# Patient Record
Sex: Female | Born: 1990 | Race: Black or African American | Hispanic: No | Marital: Single | Smoking: Never smoker
Health system: Southern US, Community
[De-identification: ages and names within clinical notes are randomized; demographics above are authoritative.]

## PROBLEM LIST (undated history)

## (undated) DIAGNOSIS — G43909 Migraine, unspecified, not intractable, without status migrainosus: Secondary | ICD-10-CM

---

## 2013-03-07 ENCOUNTER — Encounter (HOSPITAL_BASED_OUTPATIENT_CLINIC_OR_DEPARTMENT_OTHER): Payer: Self-pay | Admitting: Emergency Medicine

## 2013-03-07 ENCOUNTER — Emergency Department (HOSPITAL_BASED_OUTPATIENT_CLINIC_OR_DEPARTMENT_OTHER): Payer: BC Managed Care – PPO

## 2013-03-07 ENCOUNTER — Emergency Department (HOSPITAL_BASED_OUTPATIENT_CLINIC_OR_DEPARTMENT_OTHER)
Admission: EM | Admit: 2013-03-07 | Discharge: 2013-03-07 | Disposition: A | Discharge status: Home / Self Care | Payer: BC Managed Care – PPO | Attending: Emergency Medicine | Admitting: Emergency Medicine

## 2013-03-07 DIAGNOSIS — B9789 Other viral agents as the cause of diseases classified elsewhere: Secondary | ICD-10-CM | POA: Insufficient documentation

## 2013-03-07 DIAGNOSIS — J3489 Other specified disorders of nose and nasal sinuses: Secondary | ICD-10-CM | POA: Insufficient documentation

## 2013-03-07 DIAGNOSIS — B349 Viral infection, unspecified: Secondary | ICD-10-CM

## 2013-03-07 DIAGNOSIS — Z3202 Encounter for pregnancy test, result negative: Secondary | ICD-10-CM | POA: Insufficient documentation

## 2013-03-07 DIAGNOSIS — G43909 Migraine, unspecified, not intractable, without status migrainosus: Secondary | ICD-10-CM | POA: Insufficient documentation

## 2013-03-07 HISTORY — DX: Migraine, unspecified, not intractable, without status migrainosus: G43.909

## 2013-03-07 LAB — URINALYSIS, ROUTINE W REFLEX MICROSCOPIC
Bilirubin Urine: NEGATIVE
Glucose, UA: NEGATIVE mg/dL
Hgb urine dipstick: NEGATIVE
Leukocytes, UA: NEGATIVE
Protein, ur: NEGATIVE mg/dL
pH: 5 (ref 5.0–8.0)

## 2013-03-07 LAB — PREGNANCY, URINE: Preg Test, Ur: NEGATIVE

## 2013-03-07 MED ORDER — METOCLOPRAMIDE HCL 5 MG/ML IJ SOLN
10.0000 mg | Freq: Once | INTRAMUSCULAR | Status: AC
  Administered 2013-03-07: 10 mg via INTRAMUSCULAR
  Filled 2013-03-07: qty 2

## 2013-03-07 MED ORDER — IBUPROFEN 800 MG PO TABS: 800.0000 mg | ORAL_TABLET | Freq: Three times a day (TID) | ORAL | Status: DC

## 2013-03-07 MED ORDER — KETOROLAC TROMETHAMINE 60 MG/2ML IM SOLN
60.0000 mg | Freq: Once | INTRAMUSCULAR | Status: AC
  Administered 2013-03-07: 60 mg via INTRAMUSCULAR
  Filled 2013-03-07: qty 2

## 2013-03-07 MED ORDER — DIPHENHYDRAMINE HCL 50 MG/ML IJ SOLN
12.5000 mg | Freq: Once | INTRAMUSCULAR | Status: AC
  Administered 2013-03-07: 12.5 mg via INTRAMUSCULAR
  Filled 2013-03-07: qty 1

## 2013-03-07 NOTE — ED Notes (Signed)
Denies known contact with anyone who has been ill or has travelled in the past 21 days.

## 2013-03-07 NOTE — ED Notes (Signed)
PO challenge Sprite

## 2013-03-07 NOTE — ED Notes (Signed)
Pt reports fever (unmeasured) for one week, with stuffy nose, body aches, and intermittent vomiting and diarrhea (last episode yesterday morning). Pt has history of "daily" migraines.

## 2013-03-07 NOTE — ED Provider Notes (Signed)
CSN: [id number]     Arrival date & time 03/07/13  0228 History   First MD Initiated Contact with Patient 03/07/13 0247     Chief Complaint  Patient presents with  . Fever   (Consider location/radiation/quality/duration/timing/severity/associated sxs/prior Treatment) Patient is a 22 y.o. female presenting with URI. The history is provided by the patient.  URI Presenting symptoms: congestion and rhinorrhea   Congestion:    Location:  Nasal   Interferes with sleep: no     Interferes with eating/drinking: no   Rhinorrhea:    Quality:  Clear   Severity:  Moderate   Timing:  Constant   Progression:  Unchanged Severity:  Moderate Onset quality:  Gradual Timing:  Constant Progression:  Unchanged Chronicity:  New Relieved by:  Nothing Worsened by:  Nothing tried Ineffective treatments:  None tried Associated symptoms: no neck pain, no swollen glands and no wheezing   Associated symptoms comment:  Has had daily migraines for years nothing different.  No travel x 2 years no tick exposure no rashes Risk factors: no recent travel   loose stool x 1 day and vomiting 2 days ago  Past Medical History  Diagnosis Date  . Migraine    History reviewed. No pertinent past surgical history. History reviewed. No pertinent family history. History  Substance Use Topics  . Smoking status: Never Smoker   . Smokeless tobacco: Never Used  . Alcohol Use: No   OB History   Grav Para Term Preterm Abortions TAB SAB Ect Mult Living                 Review of Systems  HENT: Positive for congestion and rhinorrhea.   Respiratory: Negative for wheezing.   Musculoskeletal: Negative for neck pain.  All other systems reviewed and are negative.    Allergies  Review of patient's allergies indicates no known allergies.  Home Medications   Current Outpatient Rx  Name  Route  Sig  Dispense  Refill  . acetaminophen (TYLENOL) 500 MG tablet   Oral   Take 500 mg by mouth every 6 (six) hours as needed  for pain.         Marland Kitchen aspirin-acetaminophen-caffeine (EXCEDRIN MIGRAINE) 250-250-65 MG per tablet   Oral   Take 1 tablet by mouth every 6 (six) hours as needed for pain.          BP 139/74  Pulse 97  Temp(Src) 101.9 F (38.8 C) (Oral)  Resp 24  Ht 5\' 3"  (1.6 m)  SpO2 100%  LMP 02/05/2013 Physical Exam  Constitutional: She is oriented to person, place, and time. She appears well-developed and well-nourished. No distress.  HENT:  Head: Normocephalic and atraumatic.  Mouth/Throat: Oropharynx is clear and moist.  Eyes: Conjunctivae are normal. Pupils are equal, round, and reactive to light.  Neck: Normal range of motion. Neck supple.  No meningeal signs  Cardiovascular: Normal rate, regular rhythm and intact distal pulses.   Pulmonary/Chest: Effort normal and breath sounds normal. She has no wheezes. She has no rales.  Abdominal: Soft. Bowel sounds are normal. There is no tenderness. There is no rebound and no guarding.  Lymphadenopathy:    She has no cervical adenopathy.  Neurological: She is alert and oriented to person, place, and time. She has normal reflexes. No cranial nerve deficit.  Skin: Skin is warm and dry.  Psychiatric: She has a normal mood and affect.    ED Course  Procedures (including critical care time) Labs Review Labs Reviewed  URINALYSIS, ROUTINE W REFLEX MICROSCOPIC  PREGNANCY, URINE   Imaging Review No results found.  EKG Interpretation   None       MDM  No diagnosis found. Viral syndrome.  No indication for labs at this time.  Will treat symptomatically with high dose ibuprofen.  Migraine no different than usual no indication for LP.  Follow up with your regular doctor for ongoing care.      Jasmine Awe, MD 03/07/13 [phone]

## 2014-10-20 ENCOUNTER — Encounter (HOSPITAL_BASED_OUTPATIENT_CLINIC_OR_DEPARTMENT_OTHER): Payer: Self-pay | Admitting: *Deleted

## 2014-10-20 DIAGNOSIS — T366X5A Adverse effect of rifampicins, initial encounter: Secondary | ICD-10-CM | POA: Diagnosis not present

## 2014-10-20 DIAGNOSIS — Z79899 Other long term (current) drug therapy: Secondary | ICD-10-CM | POA: Insufficient documentation

## 2014-10-20 DIAGNOSIS — R21 Rash and other nonspecific skin eruption: Secondary | ICD-10-CM | POA: Diagnosis present

## 2014-10-20 DIAGNOSIS — G43909 Migraine, unspecified, not intractable, without status migrainosus: Secondary | ICD-10-CM | POA: Insufficient documentation

## 2014-10-20 DIAGNOSIS — Z791 Long term (current) use of non-steroidal anti-inflammatories (NSAID): Secondary | ICD-10-CM | POA: Diagnosis not present

## 2014-10-20 DIAGNOSIS — L270 Generalized skin eruption due to drugs and medicaments taken internally: Secondary | ICD-10-CM | POA: Insufficient documentation

## 2014-10-20 NOTE — ED Notes (Signed)
Pt c/o rash to face x 3 days 

## 2014-10-21 ENCOUNTER — Emergency Department (HOSPITAL_BASED_OUTPATIENT_CLINIC_OR_DEPARTMENT_OTHER)
Admission: EM | Admit: 2014-10-21 | Discharge: 2014-10-21 | Disposition: A | Discharge status: Home / Self Care | Payer: 59 | Attending: Emergency Medicine | Admitting: Emergency Medicine

## 2014-10-21 DIAGNOSIS — L270 Generalized skin eruption due to drugs and medicaments taken internally: Secondary | ICD-10-CM

## 2014-10-21 MED ORDER — FAMOTIDINE 20 MG PO TABS: 20.0000 mg | ORAL_TABLET | Freq: Every day | ORAL | Status: DC

## 2014-10-21 MED ORDER — PREDNISONE 20 MG PO TABS: 60.0000 mg | ORAL_TABLET | Freq: Every day | ORAL | Status: DC

## 2014-10-21 MED ORDER — FAMOTIDINE 20 MG PO TABS
20.0000 mg | ORAL_TABLET | Freq: Once | ORAL | Status: AC
  Administered 2014-10-21: 20 mg via ORAL
  Filled 2014-10-21: qty 1

## 2014-10-21 MED ORDER — DIPHENHYDRAMINE HCL 25 MG PO TABS: 25.0000 mg | ORAL_TABLET | Freq: Three times a day (TID) | ORAL | Status: DC | PRN

## 2014-10-21 MED ORDER — PREDNISONE 50 MG PO TABS
60.0000 mg | ORAL_TABLET | Freq: Once | ORAL | Status: AC
  Administered 2014-10-21: 60 mg via ORAL
  Filled 2014-10-21 (×2): qty 1

## 2014-10-21 NOTE — Discharge Instructions (Signed)
Drug Allergy °Allergic reactions to medicines are common. Some allergic reactions are mild. A delayed type of drug allergy that occurs 1 week or more after exposure to a medicine or vaccine is called serum sickness. A life-threatening, sudden (acute) allergic reaction that involves the whole body is called anaphylaxis. °CAUSES  °"True" drug allergies occur when there is an allergic reaction to a medicine. This is caused by overactivity of the immune system. First, the body becomes sensitized. The immune system is triggered by your first exposure to the medicine. Following this first exposure, future exposure to the same medicine may be life-threatening. °Almost any medicine can cause an allergic reaction. Common ones are: °· Penicillin. °· Sulfonamides (sulfa drugs). °· Local anesthetics. °· X-ray dyes that contain iodine. °SYMPTOMS  °Common symptoms of a minor allergic reaction are: °· Swelling around the mouth. °· An itchy red rash or hives. °· Vomiting or diarrhea. °Anaphylaxis can cause swelling of the mouth and throat. This makes it difficult to breathe and swallow. Severe reactions can be fatal within seconds, even after exposure to only a trace amount of the drug that causes the reaction. °HOME CARE INSTRUCTIONS  °· If you are unsure of what caused your reaction, keep a diary of foods and medicines used. Include the symptoms that followed. Avoid anything that causes reactions. °· You may want to follow up with an allergy specialist after the reaction has cleared in order to be tested to confirm the allergy. It is important to confirm that your reaction is an allergy, not just a side effect to the medicine. If you have a true allergy to a medicine, this may prevent that medicine and related medicines from being given to you when you are very ill. °· If you have hives or a rash: °¨ Take medicines as directed by your caregiver. °¨ You may use an over-the-counter antihistamine (diphenhydramine) as  needed. °¨ Apply cold compresses to the skin or take baths in cool water. Avoid hot baths or showers. °· If you are severely allergic: °¨ Continuous observation after a severe reaction may be needed. Hospitalization is often required. °¨ Wear a medical alert bracelet or necklace stating your allergy. °¨ You and your family must learn how to use an anaphylaxis kit or give an epinephrine injection to temporarily treat an emergency allergic reaction. If you have had a severe reaction, always carry your epinephrine injection or anaphylaxis kit with you. This can be lifesaving if you have a severe reaction. °· Do not drive or perform tasks after treatment until the medicines used to treat your reaction have worn off, or until your caregiver says it is okay. °SEEK MEDICAL CARE IF:  °· You think you had an allergic reaction. Symptoms usually start within 30 minutes after exposure. °· Symptoms are getting worse rather than better. °· You develop new symptoms. °· The symptoms that brought you to your caregiver return. °SEEK IMMEDIATE MEDICAL CARE IF:  °· You have swelling of the mouth, difficulty breathing, or wheezing. °· You have a tight feeling in your chest or throat. °· You develop hives, swelling, or itching all over your body. °· You develop severe vomiting or diarrhea. °· You feel faint or pass out. °This is an emergency. Use your epinephrine injection or anaphylaxis kit as you have been instructed. Call for emergency medical help. Even if you improve after the injection, you need to be examined at a hospital emergency department. °MAKE SURE YOU:  °· Understand these instructions. °· Will watch   your condition.  Will get help right away if you are not doing well or get worse. Document Released: 04/30/2005 Document Revised: 07/23/2011 Document Reviewed: 10/04/2010 [facility] Patient Information 2015 [place], [place]. This information is not intended to replace advice given to you by your health care provider. Make  sure you discuss any questions you have with your health care provider.

## 2014-10-21 NOTE — ED Provider Notes (Signed)
CSN: [id number]     Arrival date & time 10/20/14  2304 History   First MD Initiated Contact with Patient 10/21/14 0047     Chief Complaint  Patient presents with  . Rash     (Consider location/radiation/quality/duration/timing/severity/associated sxs/prior Treatment) HPI  This a 24 year old female who presents with a rash to her face. Patient reports 3 day history of itchy rash over her face. She states that it burns and itches. Denies any new lotions, soaps, detergents, hair products, foods. 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 Denies any shortness of breath, chest pain. Has not taken anything for her symptoms.  Past Medical History  Diagnosis Date  . Migraine    History reviewed. No pertinent past surgical history. History reviewed. No pertinent family history. History  Substance Use Topics  . Smoking status: Never Smoker   . Smokeless tobacco: Never Used  . Alcohol Use: No   OB History    No data available     Review of Systems  Constitutional: Negative for fever.  Respiratory: Negative for chest tightness, shortness of breath and wheezing.   Cardiovascular: Negative for chest pain.  Skin: Positive for rash.  All other systems reviewed and are negative.     Allergies  Review of patient's allergies indicates no known allergies.  Home Medications   Prior to Admission medications   Medication Sig Start Date End Date Taking? Authorizing Provider  rifampin (RIFADIN) 300 MG capsule Take by mouth daily.   Yes Historical Provider, MD  acetaminophen (TYLENOL) 500 MG tablet Take 500 mg by mouth every 6 (six) hours as needed for pain.    Historical Provider, MD  aspirin-acetaminophen-caffeine (EXCEDRIN MIGRAINE) [phone] MG per tablet Take 1 tablet by mouth every 6 (six) hours as needed for pain.    Historical Provider, MD  diphenhydrAMINE (BENADRYL) 25 MG tablet Take 1 tablet (25 mg total) by mouth every 8  (eight) hours as needed for itching. 10/21/14   Shon Baton, MD  famotidine (PEPCID) 20 MG tablet Take 1 tablet (20 mg total) by mouth daily. 10/21/14   Shon Baton, MD  ibuprofen (ADVIL,MOTRIN) 800 MG tablet Take 1 tablet (800 mg total) by mouth 3 (three) times daily. 03/07/13   April Palumbo, MD  predniSONE (DELTASONE) 20 MG tablet Take 3 tablets (60 mg total) by mouth daily with breakfast. 10/21/14   Shon Baton, MD   BP 132/82 mmHg  Temp(Src) 98.5 F (36.9 C)  Resp 18  Ht  (1.6 m)  Wt 117 lb (53.071 kg)  BMI 20.73 kg/m2  SpO2 98% Physical Exam  Constitutional: She is oriented to person, place, and time. She appears well-developed and well-nourished. No distress.  HENT:  Head: Normocephalic and atraumatic.  Cardiovascular: Normal rate, regular rhythm and normal heart sounds.   Pulmonary/Chest: Effort normal. No respiratory distress. She has no wheezes.  Neurological: She is alert and oriented to person, place, and time.  Skin: Skin is warm and dry.  Fine papular rash over the face, no significant erythema  Psychiatric: She has a normal mood and affect.  Nursing note and vitals reviewed.   ED Course  Procedures (including critical care time) Labs Review Labs Reviewed - No data to display  Imaging Review No results found.   EKG Interpretation None      MDM   Final diagnoses:  Drug rash    Patient presents with rash over the face. It is  itchy and burning. Suspect allergic component versus dermatitis. Rifampin is associated with a drug rash. Unclear if this is the cause. Will place patient on spheroids, Pepcid, and Benadryl. 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  After history, exam, and medical workup I feel the patient has been appropriately medically screened and is safe for discharge home. Pertinent diagnoses were discussed with the patient. Patient was given return  precautions.     Shon Baton, MD 10/21/14 [phone]

## 2014-10-21 NOTE — ED Notes (Signed)
C/o itching and burning to face and eyes x 3 days,,  Denies any changes in soaps, etc.

## 2015-01-16 IMAGING — CR DG CHEST 2V
2 series · 2 of 2 positions shown · non-contrast
Comparison: None available

CLINICAL DATA: Fever

EXAM:
CHEST  2 VIEW

[w chest pa]
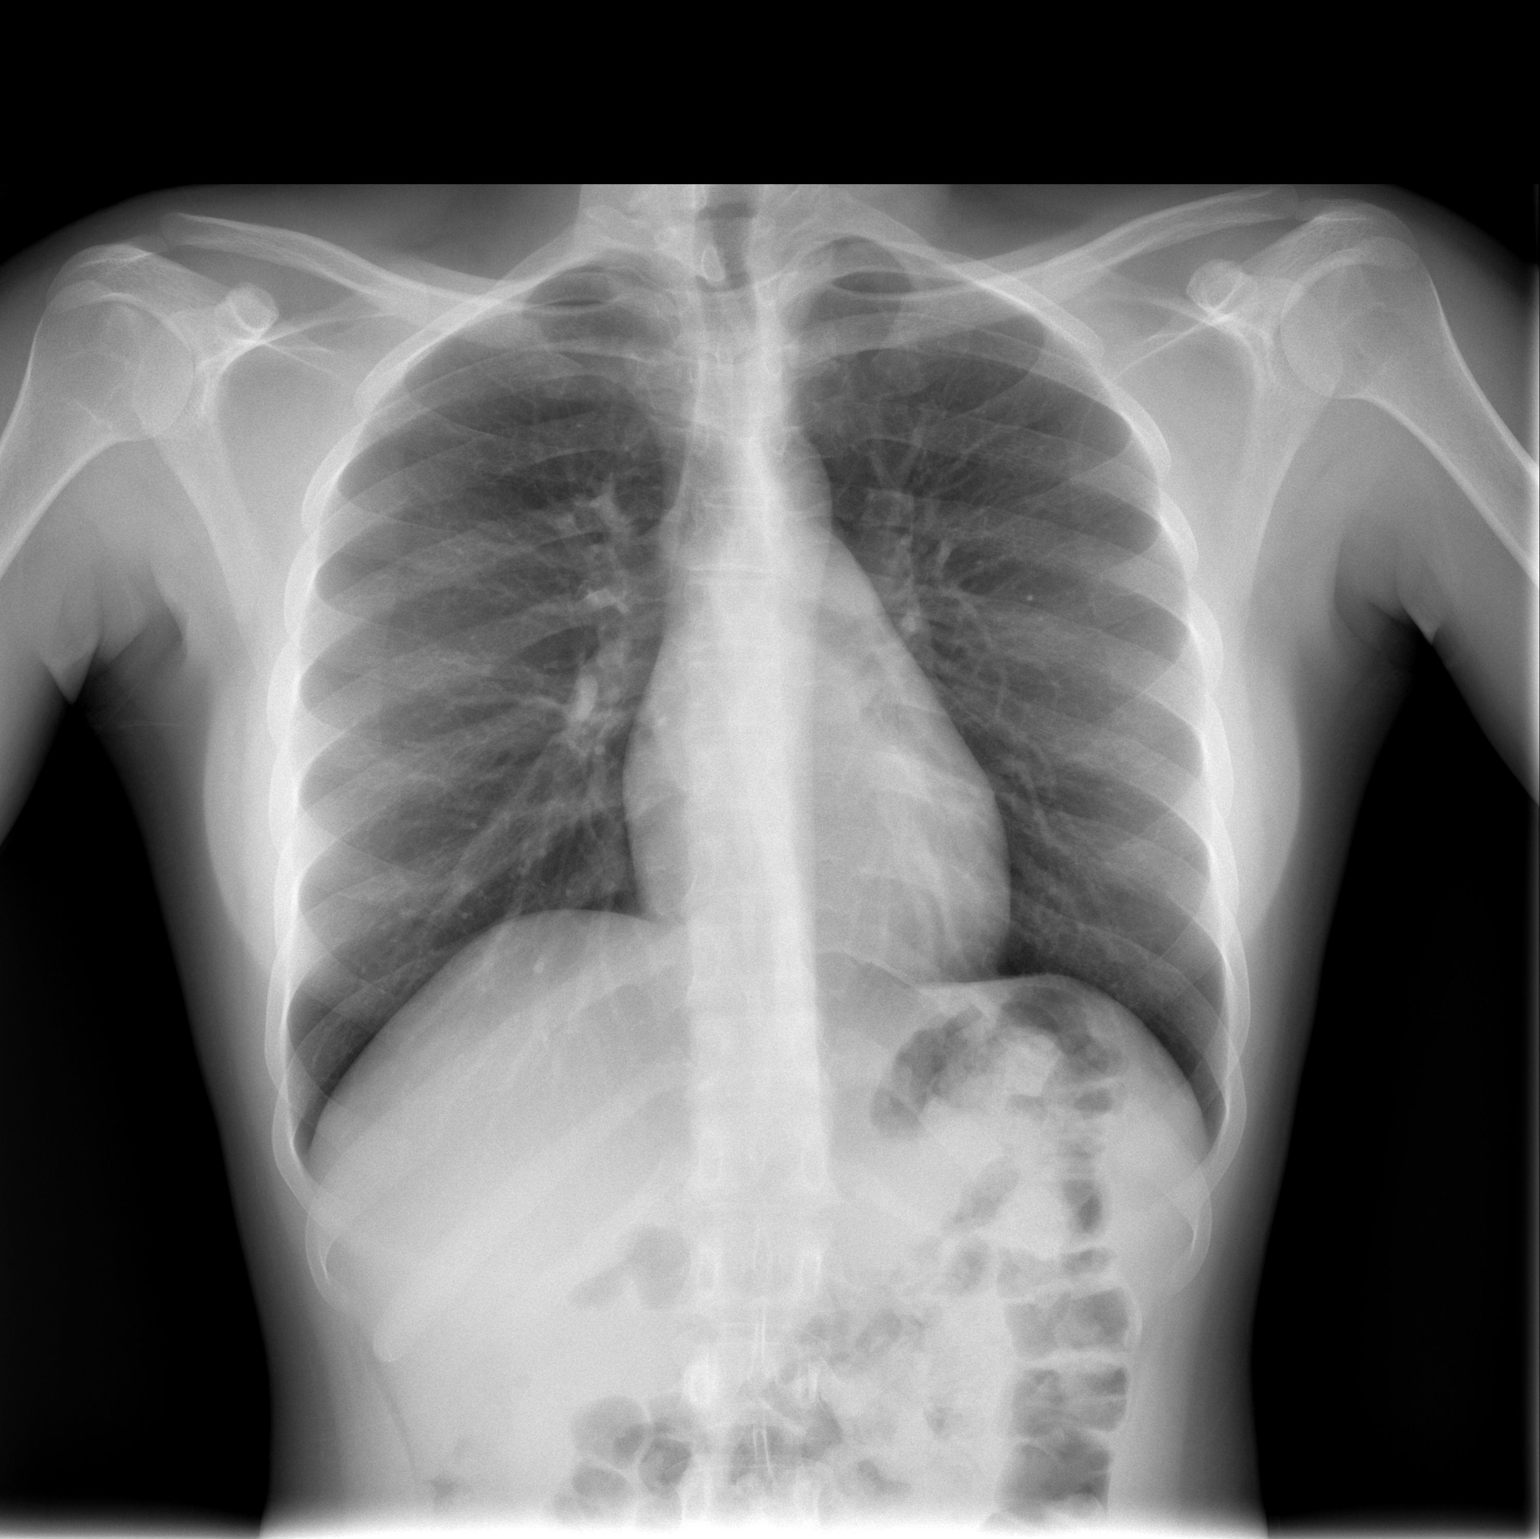

[w chest lat]
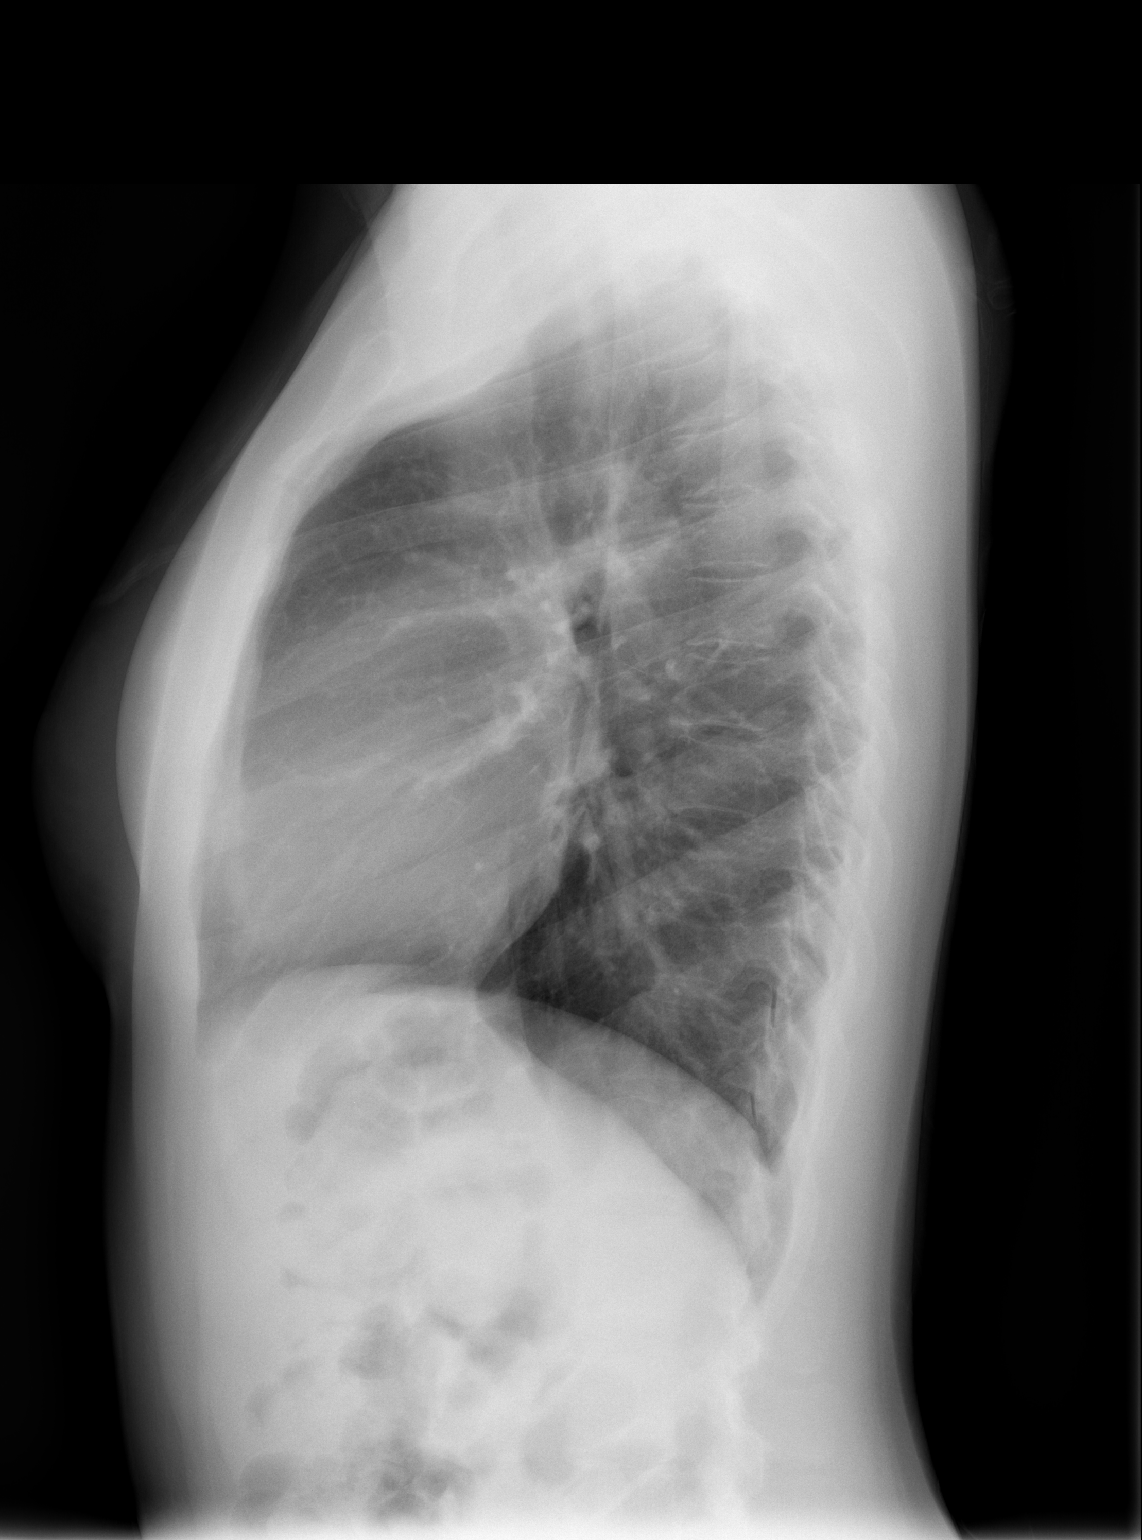

[2 of 2 positions shown; findings below may reference images not displayed]

FINDINGS: The cardiac and mediastinal silhouettes are within normal limits.

The lungs are normally inflated. No airspace consolidation, pleural
effusion, or pulmonary edema is identified. There is no
pneumothorax.

No acute osseous abnormality identified.
IMPRESSION: Normal radiograph of the chest with no focal infiltrate to suggest
acute infectious pneumonitis identified.

## 2018-12-18 ENCOUNTER — Ambulatory Visit: Payer: Self-pay | Admitting: Podiatry

## 2021-11-22 ENCOUNTER — Encounter (HOSPITAL_BASED_OUTPATIENT_CLINIC_OR_DEPARTMENT_OTHER): Payer: Self-pay | Admitting: Emergency Medicine

## 2021-11-22 ENCOUNTER — Emergency Department (HOSPITAL_BASED_OUTPATIENT_CLINIC_OR_DEPARTMENT_OTHER): Payer: BC Managed Care – PPO

## 2021-11-22 ENCOUNTER — Emergency Department (HOSPITAL_BASED_OUTPATIENT_CLINIC_OR_DEPARTMENT_OTHER)
Admission: EM | Admit: 2021-11-22 | Discharge: 2021-11-22 | Disposition: A | Discharge status: Home / Self Care | Payer: BC Managed Care – PPO | Attending: Emergency Medicine | Admitting: Emergency Medicine

## 2021-11-22 ENCOUNTER — Other Ambulatory Visit: Payer: Self-pay

## 2021-11-22 DIAGNOSIS — J029 Acute pharyngitis, unspecified: Secondary | ICD-10-CM | POA: Diagnosis present

## 2021-11-22 DIAGNOSIS — U071 COVID-19: Secondary | ICD-10-CM | POA: Insufficient documentation

## 2021-11-22 DIAGNOSIS — Z7982 Long term (current) use of aspirin: Secondary | ICD-10-CM | POA: Diagnosis not present

## 2021-11-22 LAB — SARS CORONAVIRUS 2 BY RT PCR: SARS Coronavirus 2 by RT PCR: POSITIVE — AB

## 2021-11-22 LAB — GROUP A STREP BY PCR: Group A Strep by PCR: NOT DETECTED

## 2021-11-22 MED ORDER — KETOROLAC TROMETHAMINE 30 MG/ML IJ SOLN
30.0000 mg | Freq: Once | INTRAMUSCULAR | Status: AC
  Administered 2021-11-22: 30 mg via INTRAMUSCULAR
  Filled 2021-11-22: qty 1

## 2021-11-22 NOTE — ED Provider Notes (Signed)
[facility] EMERGENCY DEPARTMENT Provider Note   CSN: [id number] Arrival date & time: 11/22/21  1402     History  Chief Complaint  Patient presents with   Sore Throat   Shortness of Breath    Joann Jorge is a 31 y.o. female with a past medical history here for evaluation of feeling well over last few days.  Yesterday developed myalgias, sore throat, cough, headache.  Taking OTC meds without relief.  No sudden onset thunderclap headache.  No numbness, weakness.  No sick contacts.  Some chills without documented fever.  No chest pain, hemoptysis, emesis, abdominal pain, lower extremity swelling or pain.  No history of PE DVT.  HPI     Home Medications Prior to Admission medications   Medication Sig Start Date End Date Taking? Authorizing Provider  acetaminophen (TYLENOL) 500 MG tablet Take 500 mg by mouth every 6 (six) hours as needed for pain.    [provider]  aspirin-acetaminophen-caffeine (EXCEDRIN MIGRAINE) [phone] MG per tablet Take 1 tablet by mouth every 6 (six) hours as needed for pain.    [provider]  diphenhydrAMINE (BENADRYL) 25 MG tablet Take 1 tablet (25 mg total) by mouth every 8 (eight) hours as needed for itching. 10/21/14   Horton, Mayer Masker, MD  famotidine (PEPCID) 20 MG tablet Take 1 tablet (20 mg total) by mouth daily. 10/21/14   Horton, Mayer Masker, MD  ibuprofen (ADVIL,MOTRIN) 800 MG tablet Take 1 tablet (800 mg total) by mouth 3 (three) times daily. 03/07/13   Palumbo, April, MD  predniSONE (DELTASONE) 20 MG tablet Take 3 tablets (60 mg total) by mouth daily with breakfast. 10/21/14   Horton, Mayer Masker, MD  rifampin (RIFADIN) 300 MG capsule Take by mouth daily.    [provider]      Allergies    Patient has no known allergies.    Review of Systems   Review of Systems  Constitutional:  Positive for chills and fatigue.  HENT: Negative.    Respiratory:  Positive for cough.   Cardiovascular: Negative.    Gastrointestinal: Negative.   Genitourinary: Negative.   Musculoskeletal:  Positive for myalgias.  Skin: Negative.   Neurological:  Positive for headaches.  All other systems reviewed and are negative.   Physical Exam Updated Vital Signs BP 118/90   Pulse 79   Temp 98.1 F (36.7 C)   Resp 16   LMP 11/18/2021 (Exact Date)   SpO2 100%  Physical Exam Vitals and nursing note reviewed.  Constitutional:      General: She is not in acute distress.    Appearance: She is not ill-appearing, toxic-appearing or diaphoretic.  HENT:     Head: Normocephalic and atraumatic.     Jaw: There is normal jaw occlusion.     Right Ear: Tympanic membrane, ear canal and external ear normal. There is no impacted cerumen. No hemotympanum. Tympanic membrane is not injected, scarred, perforated, erythematous, retracted or bulging.     Left Ear: Tympanic membrane, ear canal and external ear normal. There is no impacted cerumen. No hemotympanum. Tympanic membrane is not injected, scarred, perforated, erythematous, retracted or bulging.     Ears:     Comments: No Mastoid tenderness.    Nose:     Comments: Clear rhinorrhea and congestion to bilateral nares.  No sinus tenderness.    Mouth/Throat:     Comments: Posterior oropharynx clear.  Mucous membranes moist.  Tonsils without erythema or exudate.  Uvula midline without deviation.  No evidence of PTA or RPA.  No drooling, dysphasia or trismus.  Phonation normal. Neck:     Trachea: Trachea and phonation normal.     Meningeal: Brudzinski's sign and Kernig's sign absent.     Comments: No Neck stiffness or neck rigidity.  No meningismus.  No cervical lymphadenopathy. Cardiovascular:     Comments: No murmurs rubs or gallops. Pulmonary:     Comments: Clear to auscultation bilaterally without wheeze, rhonchi or rales.  No accessory muscle usage.  Able speak in full sentences. Abdominal:     Comments: Soft, nontender without rebound or guarding.  No CVA  tenderness.  Musculoskeletal:     Comments: Moves all 4 extremities without difficulty.  Lower extremities without edema, erythema or warmth.  Skin:    Comments: Brisk capillary refill.  No rashes or lesions.  Neurological:     Mental Status: She is alert.     Comments: Ambulatory in department without difficulty.  Cranial nerves II through XII grossly intact.  No facial droop.  No aphasia.     ED Results / Procedures / Treatments   Labs (all labs ordered are listed, but only abnormal results are displayed) Labs Reviewed  SARS CORONAVIRUS 2 BY RT PCR - Abnormal; Notable for the following components:      Result Value   SARS Coronavirus 2 by RT PCR POSITIVE (*)    All other components within normal limits  GROUP A STREP BY PCR    EKG None  Radiology DG Chest 2 View  Result Date: 11/22/2021 CLINICAL DATA:  Shortness of breath. EXAM: CHEST - 2 VIEW COMPARISON:  Chest radiograph dated 06/23/2014. FINDINGS: The heart size and mediastinal contours are within normal limits. Both lungs are clear. The visualized skeletal structures are unremarkable. IMPRESSION: No active cardiopulmonary disease. Electronically Signed   By: Elgie Collard M.D.   On: 11/22/2021 14:32    Procedures Procedures    Medications Ordered in ED Medications  ketorolac (TORADOL) 30 MG/ML injection 30 mg (30 mg Intramuscular Given 11/22/21 1757)   ED Course/ Medical Decision Making/ A&P    31 year old here for evaluation of feeling unwell which began yesterday.  Patient with chills, headache, cough, congestion, myalgias.  No sick contacts.  Afebrile, nonseptic, not ill-appearing.  PERC negative, clinically appears well-hydrated.  Has sore throat however no clinical evidence of RPA, PTA on exam.  Labs and imaging personally viewed and interpreted: COVID-positive Strep negative Chest x-ray without infiltrates, cardiomegaly, pulm edema, pneumothorax  Patient given Toradol for headache.  She has a nonfocal  neuro exam without deficits.  Feel headache likely due to her COVID.  Low suspicion for bleed, CVA, dissection, infectious process.  Encourage rest, hydration, close follow-up with PCP, return for new worsening symptoms.  Patient agreeable. Tolerating PO intake.  The patient has been appropriately medically screened and/or stabilized in the ED. I have low suspicion for any other emergent medical condition which would require further screening, evaluation or treatment in the ED or require inpatient management.  Patient is hemodynamically stable and in no acute distress.  Patient able to ambulate in department prior to ED.  Evaluation does not show acute pathology that would require ongoing or additional emergent interventions while in the emergency department or further inpatient treatment.  I have discussed the diagnosis with the patient and answered all questions.  Pain is been managed while in the emergency department and patient has no further complaints prior to discharge.  Patient is comfortable with plan discussed in  room and is stable for discharge at this time.  I have discussed strict return precautions for returning to the emergency department.  Patient was encouraged to follow-up with PCP/specialist refer to at discharge.                            Medical Decision Making Amount and/or Complexity of Data Reviewed External Data Reviewed: labs, radiology and notes. Labs: ordered. Decision-making details documented in ED Course. Radiology: ordered and independent interpretation performed. Decision-making details documented in ED Course.  Risk OTC drugs. Prescription drug management. Parenteral controlled substances. Diagnosis or treatment significantly limited by social determinants of health.          Final Clinical Impression(s) / ED Diagnoses Final diagnoses:  COVID    Rx / DC Orders ED Discharge Orders     None         Linwood Dibbles, PA-C 11/22/21 1804     Cheryll Cockayne, MD 11/25/21 2321

## 2021-11-22 NOTE — Discharge Instructions (Addendum)
Take the mediations as prescribed  Return for new or worsening symptoms

## 2021-11-22 NOTE — ED Triage Notes (Signed)
Pt reports sore throat, chills, headache and intermittent shortness of breath since yesterday.

## 2022-01-09 ENCOUNTER — Encounter (HOSPITAL_BASED_OUTPATIENT_CLINIC_OR_DEPARTMENT_OTHER): Payer: Self-pay | Admitting: Emergency Medicine

## 2022-01-09 ENCOUNTER — Other Ambulatory Visit: Payer: Self-pay

## 2022-01-09 ENCOUNTER — Emergency Department (HOSPITAL_BASED_OUTPATIENT_CLINIC_OR_DEPARTMENT_OTHER)
Admission: EM | Admit: 2022-01-09 | Discharge: 2022-01-09 | Disposition: A | Discharge status: Home / Self Care | Payer: BC Managed Care – PPO | Attending: Emergency Medicine | Admitting: Emergency Medicine

## 2022-01-09 DIAGNOSIS — Z7982 Long term (current) use of aspirin: Secondary | ICD-10-CM | POA: Insufficient documentation

## 2022-01-09 DIAGNOSIS — Z79899 Other long term (current) drug therapy: Secondary | ICD-10-CM | POA: Insufficient documentation

## 2022-01-09 DIAGNOSIS — Z202 Contact with and (suspected) exposure to infections with a predominantly sexual mode of transmission: Secondary | ICD-10-CM | POA: Insufficient documentation

## 2022-01-09 DIAGNOSIS — Z113 Encounter for screening for infections with a predominantly sexual mode of transmission: Secondary | ICD-10-CM

## 2022-01-09 DIAGNOSIS — R21 Rash and other nonspecific skin eruption: Secondary | ICD-10-CM | POA: Insufficient documentation

## 2022-01-09 LAB — URINALYSIS, ROUTINE W REFLEX MICROSCOPIC
Bilirubin Urine: NEGATIVE
Glucose, UA: NEGATIVE mg/dL
Hgb urine dipstick: NEGATIVE
Ketones, ur: NEGATIVE mg/dL
Leukocytes,Ua: NEGATIVE
Nitrite: NEGATIVE
Protein, ur: NEGATIVE mg/dL
Specific Gravity, Urine: 1.02 (ref 1.005–1.030)
pH: 8.5 — ABNORMAL HIGH (ref 5.0–8.0)

## 2022-01-09 MED ORDER — HYDROXYZINE HCL 10 MG PO TABS: 10.0000 mg | ORAL_TABLET | Freq: Four times a day (QID) | ORAL | 0 refills | Status: AC | PRN

## 2022-01-09 MED ORDER — TRIAMCINOLONE ACETONIDE 0.1 % EX CREA: 1.0000 | TOPICAL_CREAM | Freq: Two times a day (BID) | CUTANEOUS | 0 refills | Status: AC

## 2022-01-09 NOTE — Discharge Instructions (Signed)
Apply triamcinolone cream to rash as needed as prescribed. Take hydroxyzine as needed as prescribed for itching. Follow-up with health department.

## 2022-01-09 NOTE — ED Notes (Signed)
Pt d/c home per MD order. Discharge summary reviewed, pt verbalizes understanding. Ambulatory off unit. No s/s of acute distress noted at discharge.  °

## 2022-01-09 NOTE — ED Provider Notes (Signed)
[facility] EMERGENCY DEPARTMENT Provider Note   CSN: [id number] Arrival date & time: 01/09/22  1713     History  Chief Complaint  Patient presents with   Rash   SEXUALLY TRANSMITTED DISEASE    Sherri David is a 31 y.o. female.  31 year old female presents with concerns for itchy rash to body. Starting Friday last week on her left hand/arm and has since spread to whole body.  She has not taken anything for her rash, does not live with anyone although did recently travel a few weeks ago.  Denies fevers, vomiting or other systemic symptoms.  Would also like to have general STD screening which she likes to do annually.  Not have any particular concerning symptoms in regards to STD testing request.       Home Medications Prior to Admission medications   Medication Sig Start Date End Date Taking? Authorizing Provider  hydrOXYzine (ATARAX) 10 MG tablet Take 1 tablet (10 mg total) by mouth every 6 (six) hours as needed for itching. 01/09/22  Yes Jeannie Fend, PA-C  triamcinolone cream (KENALOG) 0.1 % Apply 1 Application topically 2 (two) times daily. 01/09/22  Yes Jeannie Fend, PA-C  acetaminophen (TYLENOL) 500 MG tablet Take 500 mg by mouth every 6 (six) hours as needed for pain.    [provider]  aspirin-acetaminophen-caffeine (EXCEDRIN MIGRAINE) [phone] MG per tablet Take 1 tablet by mouth every 6 (six) hours as needed for pain.    [provider]  rifampin (RIFADIN) 300 MG capsule Take by mouth daily.    [provider]      Allergies    Patient has no known allergies.    Review of Systems   Review of Systems Negative except as per HPI Physical Exam Updated Vital Signs BP 131/89   Pulse 60   Temp 98.8 F (37.1 C) (Oral)   Resp 18   Ht 5\' 6"  (1.676 m)   Wt 59 kg   LMP 12/19/2021   SpO2 100%   BMI 20.98 kg/m  Physical Exam Vitals and nursing note reviewed.  Constitutional:      General: She is not in acute  distress.    Appearance: She is well-developed. She is not diaphoretic.  HENT:     Head: Normocephalic and atraumatic.  Cardiovascular:     Pulses: Normal pulses.  Pulmonary:     Effort: Pulmonary effort is normal.  Musculoskeletal:        General: No swelling, tenderness or deformity.  Skin:    General: Skin is warm and dry.     Findings: Rash present. No erythema.     Comments: Scattered papules measuring approximately 4 to 5 mm each located in a cluster to the left wrist, 2 lesions to the left forearm, 1 lesion on the back, 1 lesion on the abdomen no evidence of secondary infection.  Neurological:     Mental Status: She is alert and oriented to person, place, and time.     Sensory: No sensory deficit.     Motor: No weakness.     Gait: Gait normal.  Psychiatric:        Behavior: Behavior normal.     ED Results / Procedures / Treatments   Labs (all labs ordered are listed, but only abnormal results are displayed) Labs Reviewed  URINALYSIS, ROUTINE W REFLEX MICROSCOPIC - Abnormal; Notable for the following components:      Result Value   APPearance CLOUDY (*)  pH 8.5 (*)    All other components within normal limits  HIV ANTIBODY (ROUTINE TESTING W REFLEX)  RPR    EKG None  Radiology No results found.  Procedures Procedures    Medications Ordered in ED Medications - No data to display  ED Course/ Medical Decision Making/ A&P                           Medical Decision Making Amount and/or Complexity of Data Reviewed Labs: ordered.   Patient with a rash for the past few days, nonspecific, consider viral versus contact irritant versus insect bite.  She did recently travel, discussed bedbugs potentially.  Recommend topical steroids, will give Atarax to help with itching.  In regards to her STD testing request, blood work is already been obtained and is in process in the lab, These results will be available in her MyChart account, deferred any further testing to  health department or PCP.        Final Clinical Impression(s) / ED Diagnoses Final diagnoses:  Rash  Screen for STD (sexually transmitted disease)    Rx / DC Orders ED Discharge Orders          Ordered    hydrOXYzine (ATARAX) 10 MG tablet  Every 6 hours PRN        01/09/22 2203    triamcinolone cream (KENALOG) 0.1 %  2 times daily        01/09/22 2203              Jeannie Fend, PA-C 01/09/22 2204    Sloan Leiter, DO 01/12/22 [id number]

## 2022-01-09 NOTE — ED Triage Notes (Signed)
Pt arrives pov, steady gait, c/o " itchy bumps all over body" intermittently x 2 weeks. Pt also requesting STD and Hiv testing, denies exposure or symptoms

## 2022-01-10 LAB — GC/CHLAMYDIA PROBE AMP (~~LOC~~) NOT AT ARMC
Chlamydia: NEGATIVE
Comment: NEGATIVE
Comment: NORMAL
Neisseria Gonorrhea: NEGATIVE

## 2022-01-10 LAB — HIV ANTIBODY (ROUTINE TESTING W REFLEX): HIV Screen 4th Generation wRfx: NONREACTIVE

## 2022-01-10 LAB — RPR: RPR Ser Ql: NONREACTIVE

## 2022-09-12 ENCOUNTER — Other Ambulatory Visit: Payer: Self-pay

## 2022-09-12 ENCOUNTER — Emergency Department (HOSPITAL_BASED_OUTPATIENT_CLINIC_OR_DEPARTMENT_OTHER): Payer: Self-pay

## 2022-09-12 ENCOUNTER — Encounter (HOSPITAL_BASED_OUTPATIENT_CLINIC_OR_DEPARTMENT_OTHER): Payer: Self-pay | Admitting: Emergency Medicine

## 2022-09-12 ENCOUNTER — Emergency Department (HOSPITAL_BASED_OUTPATIENT_CLINIC_OR_DEPARTMENT_OTHER)
Admission: EM | Admit: 2022-09-12 | Discharge: 2022-09-12 | Disposition: A | Discharge status: Home / Self Care | Payer: Self-pay | Attending: Emergency Medicine | Admitting: Emergency Medicine

## 2022-09-12 DIAGNOSIS — O200 Threatened abortion: Secondary | ICD-10-CM | POA: Insufficient documentation

## 2022-09-12 LAB — COMPREHENSIVE METABOLIC PANEL
ALT: 6 U/L (ref 0–44)
AST: 16 U/L (ref 15–41)
Albumin: 4.2 g/dL (ref 3.5–5.0)
Alkaline Phosphatase: 43 U/L (ref 38–126)
Anion gap: 12 (ref 5–15)
BUN: 12 mg/dL (ref 6–20)
CO2: 20 mmol/L — ABNORMAL LOW (ref 22–32)
Calcium: 9.1 mg/dL (ref 8.9–10.3)
Chloride: 104 mmol/L (ref 98–111)
Creatinine, Ser: 0.79 mg/dL (ref 0.44–1.00)
GFR, Estimated: >60 mL/min (ref >60)
Glucose, Bld: 78 mg/dL (ref 70–99)
Potassium: 3.8 mmol/L (ref 3.5–5.1)
Sodium: 136 mmol/L (ref 135–145)
Total Bilirubin: 0.8 mg/dL (ref 0.3–1.2)
Total Protein: 7.7 g/dL (ref 6.5–8.1)

## 2022-09-12 LAB — CBC
HCT: 38 % (ref 36.0–46.0)
Hemoglobin: 13 g/dL (ref 12.0–15.0)
MCH: 31.7 pg (ref 26.0–34.0)
MCHC: 34.2 g/dL (ref 30.0–36.0)
MCV: 92.7 fL (ref 80.0–100.0)
Platelets: 205 10*3/uL (ref 150–400)
RBC: 4.1 MIL/uL (ref 3.87–5.11)
RDW: 13.1 % (ref 11.5–15.5)
WBC: 6 10*3/uL (ref 4.0–10.5)
nRBC: 0 % (ref 0.0–0.2)

## 2022-09-12 LAB — HCG, QUANTITATIVE, PREGNANCY: hCG, Beta Chain, Quant, S: 14 m[IU]/mL — ABNORMAL HIGH (ref <5)

## 2022-09-12 NOTE — ED Provider Notes (Signed)
Emergency Department Provider Note   I have reviewed the triage vital signs and the nursing notes.   HISTORY  Chief Complaint Vaginal Bleeding   HPI Sherri David is a 32 y.o. female with PMH of migraine presents to the ED with vaginal bleeding and cramping pain. Symptoms began this AM. She took a home pregnancy test earlier this week which was positive but has not yet established with OB. Notes one prior pregnancy which was not carried to term. She had heavy bleeding this AM with passage or clot and tissue. Bleeding has significantly reduced. She presents concerned for miscarriage.    Past Medical History:  Diagnosis Date   Migraine     Review of Systems  Constitutional: No fever/chills Cardiovascular: Denies chest pain. Respiratory: Denies shortness of breath. Gastrointestinal: Cramping lower abdominal pain.  No nausea, no vomiting.  Genitourinary: Negative for dysuria. Positive vaginal bleeding.  Musculoskeletal: Negative for back pain. Skin: Negative for rash. Neurological: Negative for headaches.   ____________________________________________   PHYSICAL EXAM:  VITAL SIGNS: ED Triage Vitals  Enc Vitals Group     BP 09/12/22 1914 128/83     Pulse Rate 09/12/22 1914 65     Resp 09/12/22 1914 17     Temp 09/12/22 1912 98.3 F (36.8 C)     Temp src --      SpO2 09/12/22 1914 100 %    Constitutional: Alert and oriented. Well appearing and in no acute distress. Eyes: Conjunctivae are normal. Head: Atraumatic. Nose: No congestion/rhinnorhea. Mouth/Throat: Mucous membranes are moist.   Neck: No stridor.  Cardiovascular: Normal rate, regular rhythm. Good peripheral circulation. Grossly normal heart sounds.   Respiratory: Normal respiratory effort.  No retractions. Lungs CTAB. Gastrointestinal: Soft and nontender. No distention.  Musculoskeletal: No lower extremity tenderness nor edema. No gross deformities of extremities. Neurologic:  Normal speech and  language. No gross focal neurologic deficits are appreciated.  Skin:  Skin is warm, dry and intact. No rash noted.  ____________________________________________   LABS (all labs ordered are listed, but only abnormal results are displayed)  Labs Reviewed  HCG, QUANTITATIVE, PREGNANCY - Abnormal; Notable for the following components:      Result Value   hCG, Beta Chain, Quant, S 14 (*)    All other components within normal limits  COMPREHENSIVE METABOLIC PANEL - Abnormal; Notable for the following components:   CO2 20 (*)    All other components within normal limits  CBC  ABO/RH   ____________________________________________  RADIOLOGY  US OB LESS THAN 14 WEEKS WITH OB TRANSVAGINAL  Result Date: 09/12/2022 CLINICAL DATA:  Vaginal bleeding in pregnancy. EXAM: OBSTETRIC <14 WK [place] AND TRANSVAGINAL OB US TECHNIQUE: Both transabdominal and transvaginal ultrasound examinations were performed for complete evaluation of the gestation as well as the maternal uterus, adnexal regions, and pelvic cul-de-sac. Transvaginal technique was performed to assess early pregnancy. COMPARISON:  None Available. FINDINGS: Intrauterine gestational sac: None Yolk sac:  Not Visualized. Embryo:  Not Visualized. Cardiac Activity: Not Visualized. Heart Rate: N/A  bpm Subchorionic hemorrhage:  None visualized. Maternal uterus/adnexae: The endometrium measures 5.0 mm in thickness. The right ovary measures 2.9 cm x 1.4 cm x 2.0 cm (volume 4.25 mL) and is normal in appearance. The left ovary measures 2.3 cm x 1.9 cm x 1.7 cm (volume 3.85 mL) and is normal in appearance. No pelvic free fluid is identified. IMPRESSION: No evidence of an intrauterine pregnancy. Correlation with follow-up pelvic ultrasound and serial beta HCG levels is recommended. Electronically  Signed   By: Aram Candela M.D.   On: 09/12/2022 21:34    ____________________________________________   PROCEDURES  Procedure(s) performed:    Procedures  None ____________________________________________   INITIAL IMPRESSION / ASSESSMENT AND PLAN / ED COURSE  Pertinent labs & imaging results that were available during my care of the patient were reviewed by me and considered in my medical decision making (see chart for details).   This patient is Presenting for Evaluation of abdominal pain, which does require a range of treatment options, and is a complaint that involves a high risk of morbidity and mortality.  The Differential Diagnoses includes but is not exclusive to ectopic pregnancy, ovarian cyst, ovarian torsion, acute appendicitis, urinary tract infection, endometriosis, bowel obstruction, hernia, colitis, renal colic, gastroenteritis, volvulus etc.    Clinical Laboratory Tests Ordered, included hCG very low at 14. No AKI. No severe anemia. O pos blood type. No Rhogam.   Radiologic Tests Ordered, included US pelvic. I independently interpreted the images and agree with radiology interpretation.   Cardiac Monitor Tracing which shows NSR.    Social Determinants of Health Risk patient is a non-smoker.   Medical Decision Making: Summary:  Patient with vaginal bleeding which is improved and passage of clot. Quant hCG is very low and no IUP evident on [place]. Suspect miscarriage clinically. Normal vitals. No rhogam indicated. Discussed the possibility of very early pregnancy and need to follow with OB for repeat quant hCG in the next 48 hours. Contact information given for patient to call in the AM. Patient appears well and stable for discharge and expectant mgmt at home.   Patient's presentation is most consistent with acute presentation with potential threat to life or bodily function.   Disposition: discharge  ____________________________________________  FINAL CLINICAL IMPRESSION(S) / ED DIAGNOSES  Final diagnoses:  Threatened miscarriage    Note:  This document was prepared using Dragon voice recognition  software and may include unintentional dictation errors.  Alona Bene, MD, [facility]    Shah Insley, Arlyss Repress, MD 09/13/22 [phone]

## 2022-09-12 NOTE — Discharge Instructions (Signed)
You were seen in the emergency room today with vaginal bleeding and very low pregnancy hormones.  While this is likely the result of a miscarriage you do need repeat lab work and possibly a follow-up ultrasound in the coming week.  Please call the OB/GYN listed for follow-up appointment this week for repeat hCG levels. You may also return to the ED if they are unable to accommodate your lab orders and appointment.

## 2022-09-12 NOTE — ED Triage Notes (Signed)
Pt states positive at home preg test on Sunday. LMP March 10th. States concern for noticed large blood clot this morning and ongoing vaginal bleeding today. Denies pain on arrival. No light headedness/weakness.

## 2022-09-13 LAB — ABO/RH: ABO/RH(D): O POS

## 2022-09-14 ENCOUNTER — Other Ambulatory Visit (HOSPITAL_BASED_OUTPATIENT_CLINIC_OR_DEPARTMENT_OTHER): Payer: Self-pay

## 2022-09-14 ENCOUNTER — Emergency Department (HOSPITAL_BASED_OUTPATIENT_CLINIC_OR_DEPARTMENT_OTHER)
Admission: EM | Admit: 2022-09-14 | Discharge: 2022-09-14 | Disposition: A | Discharge status: Home / Self Care | Payer: Self-pay | Attending: Emergency Medicine | Admitting: Emergency Medicine

## 2022-09-14 ENCOUNTER — Other Ambulatory Visit: Payer: Self-pay

## 2022-09-14 ENCOUNTER — Encounter (HOSPITAL_BASED_OUTPATIENT_CLINIC_OR_DEPARTMENT_OTHER): Payer: Self-pay

## 2022-09-14 DIAGNOSIS — Z7982 Long term (current) use of aspirin: Secondary | ICD-10-CM | POA: Insufficient documentation

## 2022-09-14 DIAGNOSIS — O039 Complete or unspecified spontaneous abortion without complication: Secondary | ICD-10-CM | POA: Insufficient documentation

## 2022-09-14 LAB — CBC
HCT: 38.7 % (ref 36.0–46.0)
Hemoglobin: 13.2 g/dL (ref 12.0–15.0)
MCH: 31.5 pg (ref 26.0–34.0)
MCHC: 34.1 g/dL (ref 30.0–36.0)
MCV: 92.4 fL (ref 80.0–100.0)
Platelets: 205 10*3/uL (ref 150–400)
RBC: 4.19 MIL/uL (ref 3.87–5.11)
RDW: 13.1 % (ref 11.5–15.5)
WBC: 4 10*3/uL (ref 4.0–10.5)
nRBC: 0 % (ref 0.0–0.2)

## 2022-09-14 LAB — HCG, SERUM, QUALITATIVE: Preg, Serum: NEGATIVE

## 2022-09-14 LAB — HCG, QUANTITATIVE, PREGNANCY: hCG, Beta Chain, Quant, S: 6 m[IU]/mL — ABNORMAL HIGH (ref <5)

## 2022-09-14 NOTE — ED Provider Notes (Signed)
[place] EMERGENCY DEPARTMENT AT [facility] Provider Note   CSN: [id number] Arrival date & time: 09/14/22  1112     History  Chief Complaint  Patient presents with   Possible Pregnancy   Abdominal Pain    Sherri David is a 32 y.o. female.  HPI Patient reports that she is following up for possible miscarriage.  She is seen 2 days ago and needed follow-up hCG levels.  She reports that she had first had heavy vaginal bleeding and passed clots.  She reports now bleeding has slowed down and about typical for her menstrual cycle.  She still has some abdominal cramping but not severe.  No fevers no chills no nausea no vomiting.    Home Medications Prior to Admission medications   Medication Sig Start Date End Date Taking? Authorizing Provider  acetaminophen (TYLENOL) 500 MG tablet Take 500 mg by mouth every 6 (six) hours as needed for pain.    [provider]  aspirin-acetaminophen-caffeine (EXCEDRIN MIGRAINE) [phone] MG per tablet Take 1 tablet by mouth every 6 (six) hours as needed for pain.    [provider]  hydrOXYzine (ATARAX) 10 MG tablet Take 1 tablet (10 mg total) by mouth every 6 (six) hours as needed for itching. 01/09/22   Army Melia A, PA-C  rifampin (RIFADIN) 300 MG capsule Take by mouth daily.    [provider]  triamcinolone cream (KENALOG) 0.1 % Apply 1 Application topically 2 (two) times daily. 01/09/22   Jeannie Fend, PA-C      Allergies    Patient has no known allergies.    Review of Systems   Review of Systems  Physical Exam Updated Vital Signs BP 133/82   Pulse 67   Temp 98.4 F (36.9 C) (Oral)   Resp 18   Ht 5\' 6"  (1.676 m)   Wt 59 kg   LMP 07/22/2022   SpO2 97%   BMI 20.99 kg/m  Physical Exam Constitutional:      Comments: Patient is alert nontoxic clinically well in appearance.  HENT:     Mouth/Throat:     Pharynx: Oropharynx is clear.  Eyes:     Extraocular Movements: Extraocular movements  intact.  Cardiovascular:     Rate and Rhythm: Normal rate and regular rhythm.  Pulmonary:     Effort: Pulmonary effort is normal.     Breath sounds: Normal breath sounds.  Abdominal:     General: There is no distension.     Palpations: Abdomen is soft.     Tenderness: There is no abdominal tenderness. There is no guarding.  Musculoskeletal:        General: Normal range of motion.  Skin:    General: Skin is warm and dry.  Neurological:     General: No focal deficit present.     Mental Status: She is oriented to person, place, and time.     Coordination: Coordination normal.  Psychiatric:        Mood and Affect: Mood normal.     ED Results / Procedures / Treatments   Labs (all labs ordered are listed, but only abnormal results are displayed) Labs Reviewed  HCG, QUANTITATIVE, PREGNANCY - Abnormal; Notable for the following components:      Result Value   hCG, Beta Chain, Quant, S 6 (*)    All other components within normal limits  HCG, SERUM, QUALITATIVE  CBC    EKG None  Radiology US OB LESS THAN 14 WEEKS WITH OB  TRANSVAGINAL  Result Date: 09/12/2022 CLINICAL DATA:  Vaginal bleeding in pregnancy. EXAM: OBSTETRIC <14 WK [place] AND TRANSVAGINAL OB US TECHNIQUE: Both transabdominal and transvaginal ultrasound examinations were performed for complete evaluation of the gestation as well as the maternal uterus, adnexal regions, and pelvic cul-de-sac. Transvaginal technique was performed to assess early pregnancy. COMPARISON:  None Available. FINDINGS: Intrauterine gestational sac: None Yolk sac:  Not Visualized. Embryo:  Not Visualized. Cardiac Activity: Not Visualized. Heart Rate: N/A  bpm Subchorionic hemorrhage:  None visualized. Maternal uterus/adnexae: The endometrium measures 5.0 mm in thickness. The right ovary measures 2.9 cm x 1.4 cm x 2.0 cm (volume 4.25 mL) and is normal in appearance. The left ovary measures 2.3 cm x 1.9 cm x 1.7 cm (volume 3.85 mL) and is normal in  appearance. No pelvic free fluid is identified. IMPRESSION: No evidence of an intrauterine pregnancy. Correlation with follow-up pelvic ultrasound and serial beta HCG levels is recommended. Electronically Signed   By: Aram Candela M.D.   On: 09/12/2022 21:34    Procedures Procedures    Medications Ordered in ED Medications - No data to display  ED Course/ Medical Decision Making/ A&P                             Medical Decision Making Amount and/or Complexity of Data Reviewed Labs: ordered.   Patient presents for follow-up after presentation for miscarriage.  She did have positive home pregnancy test but then developed heavy bleeding with passage of clots and tissues 2 days ago.  Repeat quantitative hCG is at 6.  This is down from 14 at prior visit.  Hemoglobin 13.2, platelets normal at 205.  At this time this appears consistent with completed miscarriage.  Quant has dropped and patient's bleeding has tapered off to consistent with a menstrual cycle.  She is clinically stable without hypotension, tachycardia or signs of symptomatic anemia.  Hemoglobin is at 13.2.  At this point I do feel she is stable to follow-up with GYN.  Patient is highly encouraged to get established with gynecologist for her routine healthcare and screening needs.  Return precautions for fever, worsening abdominal pain or bleeding that does not resolve reviewed.        Final Clinical Impression(s) / ED Diagnoses Final diagnoses:  Spontaneous abortion    Rx / DC Orders ED Discharge Orders     None         Arby Barrette, MD 09/14/22 1529

## 2022-09-14 NOTE — ED Triage Notes (Signed)
Patient arrives for a follow-up for possible miscarriage related to vaginal bleeding. Patient was seen here 2 days ago and recommended to follow-up for Ultrasound and HCG levels. She was unable to schedule with her obgyn. Still having moderate vaginal bleeding.

## 2022-09-14 NOTE — Discharge Instructions (Signed)
1.  Try to follow-up with a gynecologist as soon as possible for recheck.  You should have recheck for miscarriage but also you must have routine female preventative care. 2.  Return to the emergency department if you have any problems with fever, recurrence of severe pain, lightheadedness dizziness or other concerning changes.
